# Patient Record
Sex: Female | Born: 1985 | Race: Black or African American | Hispanic: No | Marital: Single | Smoking: Current every day smoker
Health system: Southern US, Community
[De-identification: ages and names within clinical notes are randomized; demographics above are authoritative.]

---

## 2013-06-11 ENCOUNTER — Encounter (HOSPITAL_COMMUNITY): Payer: Self-pay | Admitting: Emergency Medicine

## 2013-06-11 ENCOUNTER — Emergency Department (HOSPITAL_COMMUNITY)
Admission: EM | Admit: 2013-06-11 | Discharge: 2013-06-11 | Disposition: A | Discharge status: Home / Self Care | Payer: Medicaid Other | Source: Home / Self Care | Attending: Family Medicine | Admitting: Family Medicine

## 2013-06-11 DIAGNOSIS — K047 Periapical abscess without sinus: Secondary | ICD-10-CM

## 2013-06-11 MED ORDER — HYDROCODONE-ACETAMINOPHEN 5-325 MG PO TABS: 1.0000 | ORAL_TABLET | Freq: Four times a day (QID) | ORAL | Status: DC | PRN

## 2013-06-11 MED ORDER — CLINDAMYCIN HCL 300 MG PO CAPS: 300.0000 mg | ORAL_CAPSULE | Freq: Three times a day (TID) | ORAL | Status: DC

## 2013-06-11 NOTE — Discharge Instructions (Signed)
Thank you for coming in today. Clindamycin 3 times daily for dental infection Use Norco for severe pain as needed.  Take 2 Aleve twice daily for pain.   Please call Dr. Lawrence Marseillesivils office [phone] or cell [phone] [address], [place]  Cost for tooth removal $200 includes exam, Xray, and extraction and follow up visit.  Bring list of current medications with you.    Dental Abscess A dental abscess is a collection of infected fluid (pus) from a bacterial infection in the inner part of the tooth (pulp). It usually occurs at the end of the tooth's root.  CAUSES   Severe tooth decay.  Trauma to the tooth that allows bacteria to enter into the pulp, such as a broken or chipped tooth. SYMPTOMS   Severe pain in and around the infected tooth.  Swelling and redness around the abscessed tooth or in the mouth or face.  Tenderness.  Pus drainage.  Bad breath.  Bitter taste in the mouth.  Difficulty swallowing.  Difficulty opening the mouth.  Nausea.  Vomiting.  Chills.  Swollen neck glands. DIAGNOSIS   A medical and dental history will be taken.  An examination will be performed by tapping on the abscessed tooth.  X-rays may be taken of the tooth to identify the abscess. TREATMENT The goal of treatment is to eliminate the infection. You may be prescribed antibiotic medicine to stop the infection from spreading. A root canal may be performed to save the tooth. If the tooth cannot be saved, it may be pulled (extracted) and the abscess may be drained.  HOME CARE INSTRUCTIONS  Only take over-the-counter or prescription medicines for pain, fever, or discomfort as directed by your caregiver.  Rinse your mouth (gargle) often with salt water ( tsp salt in 8 oz [250 ml] of warm water) to relieve pain or swelling.  Do not drive after taking pain medicine (narcotics).  Do not apply heat to the outside of your face.  Return to your dentist for further  treatment as directed. SEEK MEDICAL CARE IF:  Your pain is not helped by medicine.  Your pain is getting worse instead of better. SEEK IMMEDIATE MEDICAL CARE IF:  You have a fever or persistent symptoms for more than 2 3 days.  You have a fever and your symptoms suddenly get worse.  You have chills or a very bad headache.  You have problems breathing or swallowing.  You have trouble opening your mouth.  You have swelling in the neck or around the eye. Document Released: 05/10/2005 Document Revised: 02/02/2012 Document Reviewed: 08/18/2010 [facility] Patient Information 2014 [employer] [place].

## 2013-06-11 NOTE — ED Notes (Signed)
C/o R lower jaw tooth abscess onset last night.

## 2013-06-11 NOTE — ED Provider Notes (Signed)
Breanna Simmons is a 28 y.o. female who presents to Urgent Care today for right lower dental pain and swelling starting yesterday evening worsening today. Patient had severe pain worse with chewing. She denies any fevers chills nausea vomiting or diarrhea. She suspects that she has a dental abscess. She does have dental and health insurance but does not know any dentists in the area. She feels well otherwise   History reviewed. No pertinent past medical history. History  Substance Use Topics  . Smoking status: Current Every Day Smoker -- 0.50 packs/day    Types: Cigarettes  . Smokeless tobacco: Not on file  . Alcohol Use: No   ROS as above Medications: No current facility-administered medications for this encounter.   Current Outpatient Prescriptions  Medication Sig Dispense Refill  . acetaminophen (TYLENOL) 650 MG CR tablet Take 1,300 mg by mouth every 8 (eight) hours as needed for pain.      . clindamycin (CLEOCIN) 300 MG capsule Take 1 capsule (300 mg total) by mouth 3 (three) times daily.  60 capsule  0  . HYDROcodone-acetaminophen (NORCO/VICODIN) 5-325 MG per tablet Take 1 tablet by mouth every 6 (six) hours as needed.  15 tablet  0    Exam:  BP 124/85  Pulse 90  Temp(Src) 99.1 F (37.3 C) (Oral)  Resp 16  SpO2 100%  LMP 05/30/2013 Gen: Well NAD HEENT: EOMI,  MMM, right jaw swelling and tenderness. Dental abscess present right lower molar. Pus is visible at the gum line. This area is very tender to touch with a tongue depressor.  Lungs: Normal work of breathing. CTABL Heart: RRR no MRG Abd: NABS, Soft. NT, ND Exts: Brisk capillary refill, warm and well perfused.   Dental injection: Consent obtained Topical numbing medicine applied to the base of the tooth 1.8 mL of Marcaine and epinephrine were injected into the base of the tooth at the junction of the gum and cheek achieving good anesthesia. Patient tolerated procedure well.  After good anesthesia a 11 blade scalpel  was used to open the abscess. A moderate amount of pus was expressed.  Assessment and Plan: 28 y.o. female with dental abscess status post dental injection and abscess incision and drainage..  Plan the patient patient on clindamycin antibiotics, provide pain medication, and refer to dentist.   Discussed warning signs or symptoms. Please see discharge instructions. Patient expresses understanding.    Rodolph BongEvan S Ragan Reale, MD 06/11/13 2104

## 2013-07-13 ENCOUNTER — Emergency Department (HOSPITAL_COMMUNITY)
Admission: EM | Admit: 2013-07-13 | Discharge: 2013-07-13 | Disposition: A | Discharge status: Home / Self Care | Payer: Medicaid Other | Source: Home / Self Care | Attending: Family Medicine | Admitting: Family Medicine

## 2013-07-13 ENCOUNTER — Encounter (HOSPITAL_COMMUNITY): Payer: Self-pay | Admitting: Emergency Medicine

## 2013-07-13 DIAGNOSIS — K044 Acute apical periodontitis of pulpal origin: Secondary | ICD-10-CM

## 2013-07-13 DIAGNOSIS — K047 Periapical abscess without sinus: Secondary | ICD-10-CM

## 2013-07-13 MED ORDER — HYDROCODONE-ACETAMINOPHEN 5-325 MG PO TABS
ORAL_TABLET | ORAL | Status: AC
  Filled 2013-07-13: qty 2

## 2013-07-13 MED ORDER — HYDROCODONE-ACETAMINOPHEN 5-325 MG PO TABS: 1.0000 | ORAL_TABLET | Freq: Four times a day (QID) | ORAL | Status: AC | PRN

## 2013-07-13 MED ORDER — AMOXICILLIN 875 MG PO TABS: 875.0000 mg | ORAL_TABLET | Freq: Two times a day (BID) | ORAL | Status: DC

## 2013-07-13 MED ORDER — IBUPROFEN 800 MG PO TABS: 800.0000 mg | ORAL_TABLET | Freq: Three times a day (TID) | ORAL | Status: AC

## 2013-07-13 MED ORDER — HYDROCODONE-ACETAMINOPHEN 5-325 MG PO TABS
2.0000 | ORAL_TABLET | Freq: Once | ORAL | Status: AC
  Administered 2013-07-13: 2 via ORAL

## 2013-07-13 NOTE — Discharge Instructions (Signed)
°  Dental Abscess °A dental abscess is a collection of infected fluid (pus) from a bacterial infection in the inner part of the tooth (pulp). It usually occurs at the end of the tooth's root.  °CAUSES  °· Severe tooth decay. °· Trauma to the tooth that allows bacteria to enter into the pulp, such as a broken or chipped tooth. °SYMPTOMS  °· Severe pain in and around the infected tooth. °· Swelling and redness around the abscessed tooth or in the mouth or face. °· Tenderness. °· Pus drainage. °· Bad breath. °· Bitter taste in the mouth. °· Difficulty swallowing. °· Difficulty opening the mouth. °· Nausea. °· Vomiting. °· Chills. °· Swollen neck glands. °DIAGNOSIS  °· A medical and dental history will be taken. °· An examination will be performed by tapping on the abscessed tooth. °· X-rays may be taken of the tooth to identify the abscess. °TREATMENT °The goal of treatment is to eliminate the infection. You may be prescribed antibiotic medicine to stop the infection from spreading. A root canal may be performed to save the tooth. If the tooth cannot be saved, it may be pulled (extracted) and the abscess may be drained.  °HOME CARE INSTRUCTIONS °· Only take over-the-counter or prescription medicines for pain, fever, or discomfort as directed by your caregiver. °· Rinse your mouth (gargle) often with salt water (¼ tsp salt in 8 oz [250 ml] of warm water) to relieve pain or swelling. °· Do not drive after taking pain medicine (narcotics). °· Do not apply heat to the outside of your face. °· Return to your dentist for further treatment as directed. °SEEK MEDICAL CARE IF: °· Your pain is not helped by medicine. °· Your pain is getting worse instead of better. °SEEK IMMEDIATE MEDICAL CARE IF: °· You have a fever or persistent symptoms for more than 2 3 days. °· You have a fever and your symptoms suddenly get worse. °· You have chills or a very bad headache. °· You have problems breathing or swallowing. °· You have trouble  opening your mouth. °· You have swelling in the neck or around the eye. °Document Released: 05/10/2005 Document Revised: 02/02/2012 Document Reviewed: 08/18/2010 °ExitCare® Patient Information ©2014 [employer]. ° ° °

## 2013-07-13 NOTE — ED Notes (Signed)
C/o left sided dental pain states "hole in tooth"  Also having pain in left ear.  No relief with tylenol.  On set last night.

## 2013-07-13 NOTE — ED Provider Notes (Signed)
CSN: [id number]     Arrival date & time 07/13/13  1228 History   First MD Initiated Contact with Patient 07/13/13 1320     Chief Complaint  Patient presents with  . Dental Pain  . Otalgia     (Consider location/radiation/quality/duration/timing/severity/associated sxs/prior Treatment) HPI Comments: [id number] c/o Dental pain for 7 days, getting much worse yesterday.  Nagging pain for a week.  No longer responding to tylenol.  Lower left posterior.  Has appt for 2 weeks with dentist to get lower right posterior molar pulled that caused abscess she was here for a couple weeks ago, that side is much better.  Rates pain 10/10.  No systemic symptoms    History reviewed. No pertinent past medical history. Past Surgical History  Procedure Laterality Date  . Cesarean section  2006,2008,2009    x3   Family History  Problem Relation Age of Onset  . Hypertension Mother   . Diabetes Mother    History  Substance Use Topics  . Smoking status: Current Every Day Smoker -- 0.50 packs/day    Types: Cigarettes  . Smokeless tobacco: Not on file  . Alcohol Use: No   OB History   Grav Para Term Preterm Abortions TAB SAB Ect Mult Living                 Review of Systems  Constitutional: Negative for fever and chills.  HENT: Positive for dental problem.   Eyes: Negative for visual disturbance.  Respiratory: Negative for cough and shortness of breath.   Cardiovascular: Negative for chest pain, palpitations and leg swelling.  Gastrointestinal: Negative for nausea, vomiting and abdominal pain.  Endocrine: Negative for polydipsia and polyuria.  Genitourinary: Negative for dysuria, urgency and frequency.  Musculoskeletal: Negative for arthralgias and myalgias.  Skin: Negative for rash.  Neurological: Negative for dizziness, weakness and light-headedness.      Allergies  Review of patient's allergies indicates no known allergies.  Home Medications   Current Outpatient Rx  Name  Route  Sig   Dispense  Refill  . acetaminophen (TYLENOL) 650 MG CR tablet   Oral   Take 1,300 mg by mouth every 8 (eight) hours as needed for pain.         Marland Kitchen. amoxicillin (AMOXIL) 875 MG tablet   Oral   Take 1 tablet (875 mg total) by mouth 2 (two) times daily.   28 tablet   0   . clindamycin (CLEOCIN) 300 MG capsule   Oral   Take 1 capsule (300 mg total) by mouth 3 (three) times daily.   60 capsule   0   . HYDROcodone-acetaminophen (NORCO) 5-325 MG per tablet   Oral   Take 1 tablet by mouth every 6 (six) hours as needed for moderate pain.   15 tablet   0   . HYDROcodone-acetaminophen (NORCO/VICODIN) 5-325 MG per tablet   Oral   Take 1 tablet by mouth every 6 (six) hours as needed.   15 tablet   0   . ibuprofen (ADVIL,MOTRIN) 800 MG tablet   Oral   Take 1 tablet (800 mg total) by mouth 3 (three) times daily.   30 tablet   1     Dispense as written.    BP 114/76  Pulse 80  Temp(Src) 98.5 F (36.9 C) (Oral)  Resp 16  SpO2 98%  LMP 06/30/2013 Physical Exam  Nursing note and vitals reviewed. Constitutional: She is oriented to person, place, and time. Vital signs are normal. She  appears well-developed and well-nourished. No distress.  HENT:  Head: Normocephalic and atraumatic.  Mouth/Throat: Abnormal dentition (Left rear lower posterior molar (wisdom tooth) is partially covered by the gums. The tissue over this is macerated, erythematous, tender. No purulent drainage).  Pulmonary/Chest: Effort normal. No respiratory distress.  Neurological: She is alert and oriented to person, place, and time. She has normal strength. Coordination normal.  Skin: Skin is warm and dry. No rash noted. She is not diaphoretic.  Psychiatric: She has a normal mood and affect. Judgment normal.    ED Course  Procedures (including critical care time) Labs Review Labs Reviewed - No data to display Imaging Review No results found.    MDM   Final diagnoses:  Dental infection   No obvious  swelling or abscess to indicate need for incision and drainage. We'll treat with amoxicillin as well as symptomatic management with ibuprofen and Norco. Followup with the dentist.    Meds ordered this encounter  Medications  . amoxicillin (AMOXIL) 875 MG tablet    Sig: Take 1 tablet (875 mg total) by mouth 2 (two) times daily.    Dispense:  28 tablet    Refill:  0    Order Specific Question:  Supervising Provider    Answer:  Linna Hoff [phone]  . ibuprofen (ADVIL,MOTRIN) 800 MG tablet    Sig: Take 1 tablet (800 mg total) by mouth 3 (three) times daily.    Dispense:  30 tablet    Refill:  1    Order Specific Question:  Supervising Provider    Answer:  Linna Hoff [phone]  . HYDROcodone-acetaminophen (NORCO) 5-325 MG per tablet    Sig: Take 1 tablet by mouth every 6 (six) hours as needed for moderate pain.    Dispense:  15 tablet    Refill:  0    Order Specific Question:  Supervising Provider    Answer:  Linna Hoff [phone]  . HYDROcodone-acetaminophen (NORCO/VICODIN) 5-325 MG per tablet 2 tablet    Sig:      Graylon Good, PA-C 07/14/13 1408

## 2013-07-17 ENCOUNTER — Emergency Department (HOSPITAL_COMMUNITY): Payer: Medicaid Other

## 2013-07-17 ENCOUNTER — Emergency Department (HOSPITAL_COMMUNITY)
Admission: EM | Admit: 2013-07-17 | Discharge: 2013-07-17 | Disposition: A | Discharge status: Home / Self Care | Payer: Medicaid Other | Attending: Emergency Medicine | Admitting: Emergency Medicine

## 2013-07-17 ENCOUNTER — Encounter (HOSPITAL_COMMUNITY): Payer: Self-pay | Admitting: Emergency Medicine

## 2013-07-17 DIAGNOSIS — Z792 Long term (current) use of antibiotics: Secondary | ICD-10-CM | POA: Insufficient documentation

## 2013-07-17 DIAGNOSIS — K0889 Other specified disorders of teeth and supporting structures: Secondary | ICD-10-CM

## 2013-07-17 DIAGNOSIS — R509 Fever, unspecified: Secondary | ICD-10-CM | POA: Insufficient documentation

## 2013-07-17 DIAGNOSIS — F17200 Nicotine dependence, unspecified, uncomplicated: Secondary | ICD-10-CM | POA: Insufficient documentation

## 2013-07-17 DIAGNOSIS — R51 Headache: Secondary | ICD-10-CM | POA: Insufficient documentation

## 2013-07-17 DIAGNOSIS — R209 Unspecified disturbances of skin sensation: Secondary | ICD-10-CM | POA: Insufficient documentation

## 2013-07-17 DIAGNOSIS — K089 Disorder of teeth and supporting structures, unspecified: Secondary | ICD-10-CM | POA: Insufficient documentation

## 2013-07-17 DIAGNOSIS — Z791 Long term (current) use of non-steroidal anti-inflammatories (NSAID): Secondary | ICD-10-CM | POA: Insufficient documentation

## 2013-07-17 LAB — BASIC METABOLIC PANEL
BUN: 8 mg/dL (ref 6–23)
CHLORIDE: 103 meq/L (ref 96–112)
CO2: 21 meq/L (ref 19–32)
CREATININE: 0.6 mg/dL (ref 0.50–1.10)
Calcium: 9.1 mg/dL (ref 8.4–10.5)
GFR calc non Af Amer: >90 mL/min (ref >90)
Glucose, Bld: 91 mg/dL (ref 70–99)
Potassium: 4.1 mEq/L (ref 3.7–5.3)
Sodium: 137 mEq/L (ref 137–147)

## 2013-07-17 LAB — CBC WITH DIFFERENTIAL/PLATELET
Basophils Absolute: 0 10*3/uL (ref 0.0–0.1)
Basophils Relative: 0 % (ref 0–1)
Eosinophils Absolute: 0.1 10*3/uL (ref 0.0–0.7)
Eosinophils Relative: 1 % (ref 0–5)
HEMATOCRIT: 36.2 % (ref 36.0–46.0)
HEMOGLOBIN: 11.6 g/dL — AB (ref 12.0–15.0)
LYMPHS ABS: 1.8 10*3/uL (ref 0.7–4.0)
LYMPHS PCT: 18 % (ref 12–46)
MCH: 25.8 pg — ABNORMAL LOW (ref 26.0–34.0)
MCHC: 32 g/dL (ref 30.0–36.0)
MCV: 80.6 fL (ref 78.0–100.0)
MONO ABS: 1.2 10*3/uL — AB (ref 0.1–1.0)
MONOS PCT: 12 % (ref 3–12)
NEUTROS ABS: 7 10*3/uL (ref 1.7–7.7)
NEUTROS PCT: 69 % (ref 43–77)
Platelets: 283 10*3/uL (ref 150–400)
RBC: 4.49 MIL/uL (ref 3.87–5.11)
RDW: 18 % — ABNORMAL HIGH (ref 11.5–15.5)
WBC: 10.1 10*3/uL (ref 4.0–10.5)

## 2013-07-17 MED ORDER — IOHEXOL 300 MG/ML  SOLN
75.0000 mL | Freq: Once | INTRAMUSCULAR | Status: AC | PRN
  Administered 2013-07-17: 75 mL via INTRAVENOUS

## 2013-07-17 MED ORDER — ONDANSETRON HCL 4 MG/2ML IJ SOLN
4.0000 mg | Freq: Once | INTRAMUSCULAR | Status: AC
  Administered 2013-07-17: 4 mg via INTRAVENOUS
  Filled 2013-07-17: qty 2

## 2013-07-17 MED ORDER — MORPHINE SULFATE 4 MG/ML IJ SOLN
4.0000 mg | Freq: Once | INTRAMUSCULAR | Status: AC
  Administered 2013-07-17: 4 mg via INTRAVENOUS
  Filled 2013-07-17: qty 1

## 2013-07-17 MED ORDER — OXYCODONE-ACETAMINOPHEN 5-325 MG PO TABS: 1.0000 | ORAL_TABLET | ORAL | Status: AC | PRN

## 2013-07-17 MED ORDER — CLINDAMYCIN PHOSPHATE 600 MG/50ML IV SOLN
600.0000 mg | Freq: Once | INTRAVENOUS | Status: AC
  Administered 2013-07-17: 600 mg via INTRAVENOUS
  Filled 2013-07-17: qty 50

## 2013-07-17 MED ORDER — PROMETHAZINE HCL 25 MG PO TABS: 25.0000 mg | ORAL_TABLET | Freq: Four times a day (QID) | ORAL | Status: AC | PRN

## 2013-07-17 MED ORDER — CLINDAMYCIN HCL 150 MG PO CAPS: 300.0000 mg | ORAL_CAPSULE | Freq: Three times a day (TID) | ORAL | Status: AC

## 2013-07-17 NOTE — ED Notes (Addendum)
Pt arrives with left facial swelling since overnight. Seen at Urgent Care for dental pain Friday and has been taking antibiotics.

## 2013-07-17 NOTE — ED Notes (Signed)
Pt returned from radiology.

## 2013-07-17 NOTE — ED Provider Notes (Signed)
CSN: [id number]     Arrival date & time 07/17/13  4098 History  This chart was scribed for Breanna Silk, PA, working with Breanna Canal, MD, by Ardelia Mems ED Scribe. This patient was seen in room TR05C/TR05C and the patient's care was started at 10:24 AM .  Chief Complaint  Patient presents with  . Dental Pain    The history is provided by the patient. No language interpreter was used.   HPI Comments: Breanna Simmons is a 28 y.o. female who presents to the Emergency Department complaining of throbbing pain on the left side of her jaw beginning yesterday evening. She also reports associated HA and lip numbness. She reports she cannot open her mouth and a fever for which she took tylenol around 7 AM. ED temperature is 99.4 F. Pt is from [place] and has been told she needs her wisdom teeth extracted. She reports she has not followed up on these recommendations. Pt reports going to urgent care 4 days ago where she received antibiotics that she is still taking. The patient has had similar symptoms in the past where she needed IV antibiotics. Pt denies h/o medical issues.    History reviewed. No pertinent past medical history. Past Surgical History  Procedure Laterality Date  . Cesarean section  2006,2008,2009    x3    Family History  Problem Relation Age of Onset  . Hypertension Mother   . Diabetes Mother     History  Substance Use Topics  . Smoking status: Current Every Day Smoker -- 0.50 packs/day    Types: Cigarettes  . Smokeless tobacco: Not on file  . Alcohol Use: No    OB History   Grav Para Term Preterm Abortions TAB SAB Ect Mult Living                 Review of Systems  Constitutional: Positive for fever.  HENT: Positive for dental problem.        Lip numbness  Neurological: Positive for headaches.  All other systems reviewed and are negative.      Allergies  Review of patient's allergies indicates no known allergies.  Home Medications   Current Outpatient Rx   Name  Route  Sig  Dispense  Refill  . acetaminophen (TYLENOL) 650 MG CR tablet   Oral   Take 1,300 mg by mouth every 8 (eight) hours as needed for pain.         Marland Kitchen amoxicillin (AMOXIL) 875 MG tablet   Oral   Take 875 mg by mouth 2 (two) times daily. For 10 days         . HYDROcodone-acetaminophen (NORCO) 5-325 MG per tablet   Oral   Take 1 tablet by mouth every 6 (six) hours as needed for moderate pain.   15 tablet   0   . ibuprofen (ADVIL,MOTRIN) 800 MG tablet   Oral   Take 1 tablet (800 mg total) by mouth 3 (three) times daily.   30 tablet   1     Dispense as written.   . clindamycin (CLEOCIN) 150 MG capsule   Oral   Take 2 capsules (300 mg total) by mouth 3 (three) times daily. May dispense as 150mg  capsules   60 capsule   0   . oxyCODONE-acetaminophen (PERCOCET/ROXICET) 5-325 MG per tablet   Oral   Take 1-2 tablets by mouth every 4 (four) hours as needed for severe pain.   12 tablet   0   . promethazine (  PHENERGAN) 25 MG tablet   Oral   Take 1 tablet (25 mg total) by mouth every 6 (six) hours as needed for nausea or vomiting.   12 tablet   0    Triage Vitals: BP 129/85  Pulse 102  Temp(Src) 99.4 F (37.4 C) (Oral)  Resp 18  SpO2 98%  LMP 06/30/2013  Physical Exam  Nursing note and vitals reviewed. Constitutional: She is oriented to person, place, and time. She appears well-developed and well-nourished. No distress.  HENT:  Head: Normocephalic and atraumatic.  Right Ear: External ear normal.  Left Ear: External ear normal.  Nose: Nose normal.  Mouth/Throat: Oropharynx is clear and moist.  Trismus, tenderness to palpation under tongue, visible swelling to the Left side of her face, no drainable abscess visualized.  Eyes: Conjunctivae are normal.  Neck: Normal range of motion.  Cardiovascular: Normal rate, regular rhythm and normal heart sounds.   Pulmonary/Chest: Effort normal and breath sounds normal. No stridor. No respiratory distress. She has  no wheezes. She has no rales.  Abdominal: Soft. She exhibits no distension.  Musculoskeletal: Normal range of motion.  Neurological: She is alert and oriented to person, place, and time. She has normal strength.  Skin: Skin is warm and dry. She is not diaphoretic. No erythema.  Psychiatric: She has a normal mood and affect. Her behavior is normal.    ED Course  Procedures (including critical care time)  DIAGNOSTIC STUDIES: Oxygen Saturation is 98% on RA, normal by my interpretation.    COORDINATION OF CARE:  10:29 AM-Discussed treatment plan which includes CT scan and diagnostic lab work with pt at bedside and pt agreed to plan.  Medications  clindamycin (CLEOCIN) IVPB 600 mg (0 mg Intravenous Stopped 07/17/13 1240)  morphine 4 MG/ML injection 4 mg (4 mg Intravenous Given 07/17/13 1133)  ondansetron (ZOFRAN) injection 4 mg (4 mg Intravenous Given 07/17/13 1131)  iohexol (OMNIPAQUE) 300 MG/ML solution 75 mL (75 mLs Intravenous Contrast Given 07/17/13 1227)    Labs Review Labs Reviewed  CBC WITH DIFFERENTIAL - Abnormal; Notable for the following:    Hemoglobin 11.6 (*)    MCH 25.8 (*)    RDW 18.0 (*)    Monocytes Absolute 1.2 (*)    All other components within normal limits  BASIC METABOLIC PANEL   Imaging Review Ct Soft Tissue Neck W Contrast  07/17/2013   CLINICAL DATA:  Left jaw pain and fever.  Trismus  EXAM: CT NECK WITH CONTRAST  TECHNIQUE: Multidetector CT imaging of the neck was performed using the standard protocol following the bolus administration of intravenous contrast.  CONTRAST:  75mL OMNIPAQUE IOHEXOL 300 MG/ML  SOLN  COMPARISON:  None.  FINDINGS: Asymmetric enlargement of the masseter muscle on the left. There is edema in the subcutaneous fat and platysmas muscle on the left. Negative for soft tissue abscess. The findings are consistent with myositis. This appears to be of dental origin with periapical left lucency around the left lower third molar as well as caries in  this tooth. Dental evaluation is suggested.  Caries and cortical breakthrough also present involving the right third lower molar without evidence of soft tissue infection.  The tongue is normal. Tonsils are symmetric without mass or abscess. Thyroid, submandibular, and parotid glands are normal.  Mild adenopathy. Left level 2B lymph node measures 12 mm. Right level 2B node measures 9 mm. No abscessed node identified. Lung apices are clear.  IMPRESSION: Findings compatible with myositis involving the left masseter muscle. This  is most likely of dental origin related to infection in the left lower third molar.  Infection also noted in the right lower third molar.  Negative for soft tissue abscess.   Electronically Signed   By: Marlan Palauharles  Clark M.D.   On: 07/17/2013 13:00    EKG Interpretation   None       MDM   Final diagnoses:  Pain, dental    Patient with toothache.  No gross abscess.  Patient initially with trismus on my exam, however this was not consistent with the nurses exam. CT soft tissue showed dental infection with deep soft tissue infection. Patient received 1 dose of IV clindamycin and was sent home with rx for clindamycin as amoxicillin tx failed. She tolerated liquids and saltines in the ED. Urged patient to follow-up with dentist. Return instructions given. Vital signs stable for discharge. Patient / Family / Caregiver informed of clinical course, understand medical decision-making process, and agree with plan.     I personally performed the services described in this documentation, which was scribed in my presence. The recorded information has been reviewed and is accurate.      Mora BellmanHannah S Jhovanny Guinta, PA-C 07/17/13 1816

## 2013-07-17 NOTE — ED Notes (Signed)
Patient states has been taking antibiotics since Friday for dental pain.  Patient states her face had not had any swelling until last night into this morning.

## 2013-07-17 NOTE — ED Notes (Signed)
IV start unsuccessful x 1.  Phlebotomy advised could not get blood draw.

## 2013-07-17 NOTE — Discharge Instructions (Signed)
Dental Pain °Toothache is pain in or around a tooth. It may get worse with chewing or with cold or heat.  °HOME CARE °· Your dentist may use a numbing medicine during treatment. If so, you may need to avoid eating until the medicine wears off. Ask your dentist about this. °· Only take medicine as told by your dentist or doctor. °· Avoid chewing food near the painful tooth until after all treatment is done. Ask your dentist about this. °GET HELP RIGHT AWAY IF:  °· The problem gets worse or new problems appear. °· You have a fever. °· There is redness and puffiness (swelling) of the face, jaw, or neck. °· You cannot open your mouth. °· There is pain in the jaw. °· There is very bad pain that is not helped by medicine. °MAKE SURE YOU:  °· Understand these instructions. °· Will watch your condition. °· Will get help right away if you are not doing well or get worse. °Document Released: 10/27/2007 Document Revised: 08/02/2011 Document Reviewed: 10/27/2007 °ExitCare® Patient Information ©2014 [employer]. ° °

## 2013-07-18 NOTE — ED Provider Notes (Signed)
Medical screening examination/treatment/procedure(s) were performed by non-physician practitioner and as supervising physician I was immediately available for consultation/collaboration.  EKG Interpretation   None         Richardean Canalavid H Vermell [place], MD 07/18/13 1125

## 2013-07-23 NOTE — ED Provider Notes (Signed)
Medical screening examination/treatment/procedure(s) were performed by resident physician or non-physician practitioner and as supervising physician I was immediately available for consultation/collaboration.   KINDL,JAMES DOUGLAS MD.   James D Kindl, MD 07/23/13 1519 

## 2014-08-11 IMAGING — CT CT NECK W/ CM
4 of 5 series · 16 of 33 positions shown, 18 images · IV contrast (APPLIED)
Comparison: None.

CLINICAL DATA: Left jaw pain and fever.  Trismus

EXAM:
CT NECK WITH CONTRAST
TECHNIQUE: Multidetector CT imaging of the neck was performed using the
standard protocol following the bolus administration of intravenous
contrast.
CONTRAST:  75mL OMNIPAQUE IOHEXOL 300 MG/ML  SOLN

[Series 3: neck 2.0 i31s 3 · axial · 0.47mm/px · z∈[+443,+557]mm · 4 of 96 slices shown, 5 images]
[im 20/96  soft-tissue]
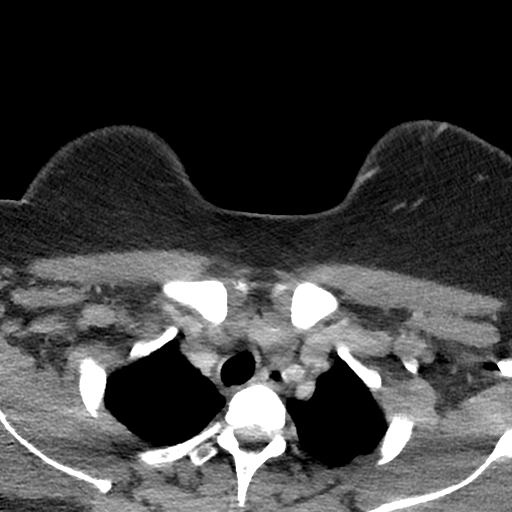
[im 20/96  bone]
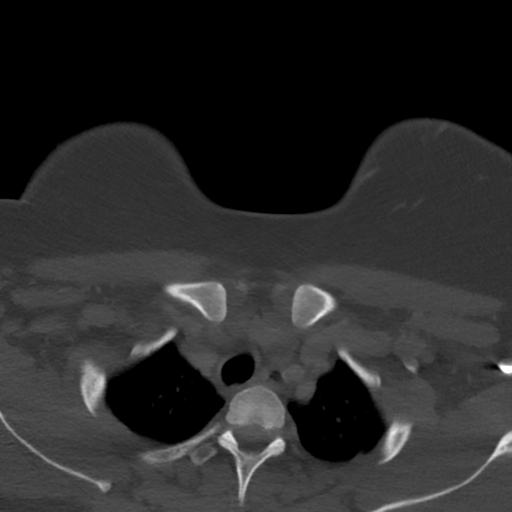
[im 39/96  bone]
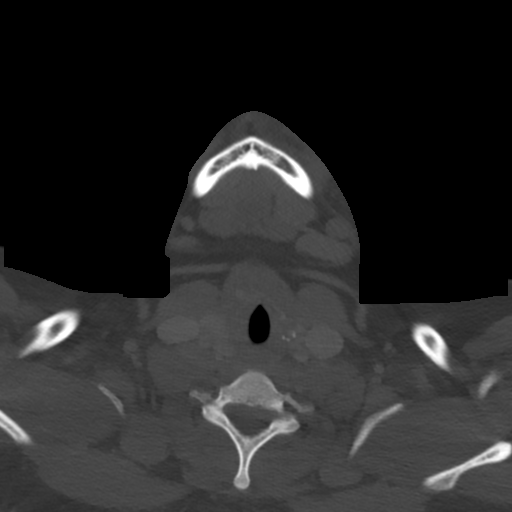
[im 58/96  bone]
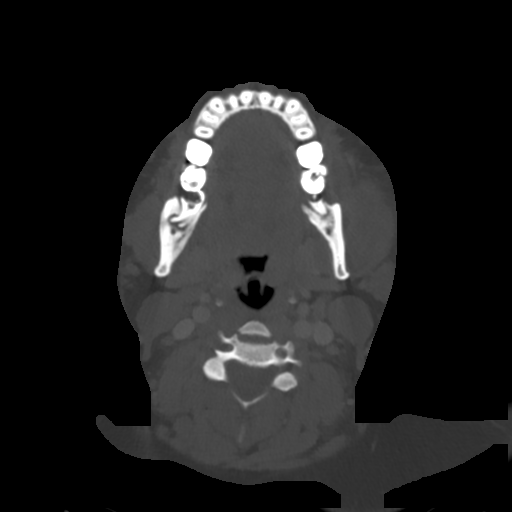
[im 77/96  bone]
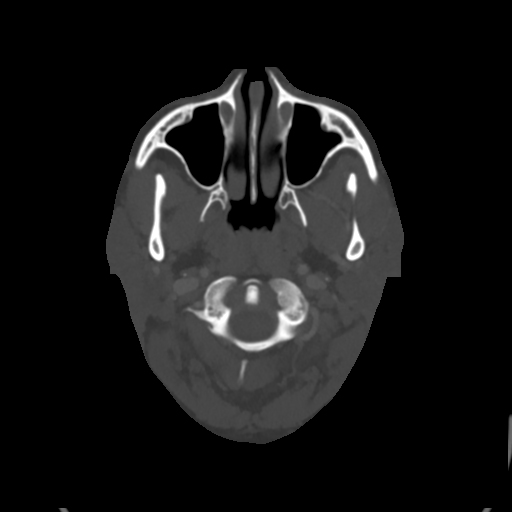

[Series 6: coronal st · coronal · 0.39mm/px · 3 of 105 slices shown]
[im 32/105  bone]
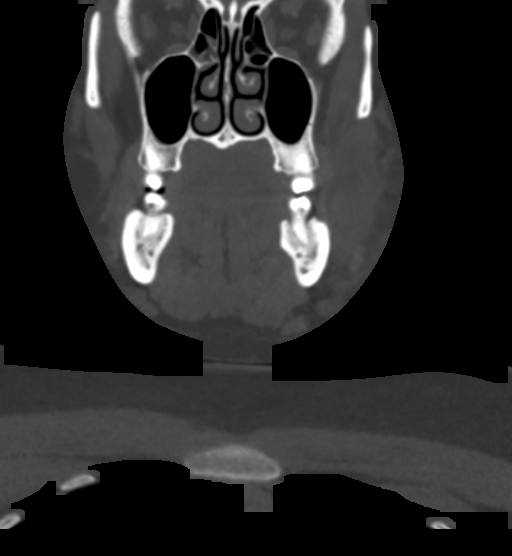
[im 46/105  bone]
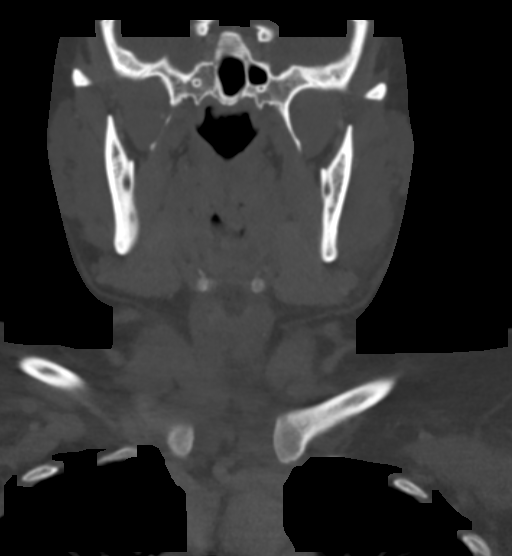
[im 59/105  bone]
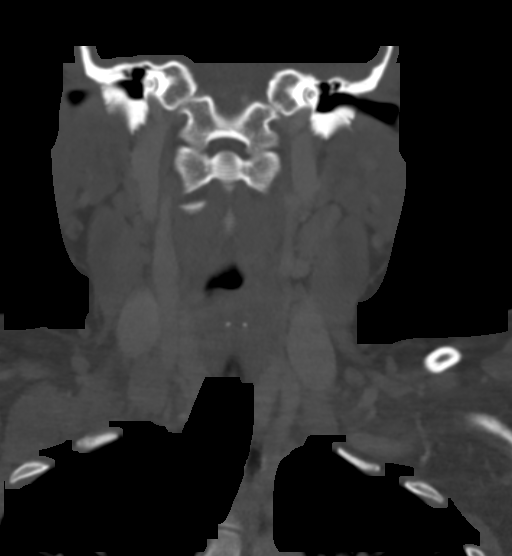

[Series 7: sagittal st · sagittal · 0.39mm/px · 5 of 84 slices shown, 6 images]
[im 28/84  bone]
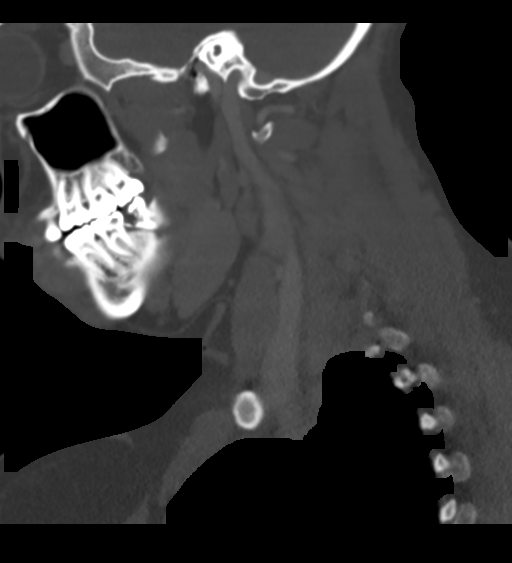
[im 35/84  bone]
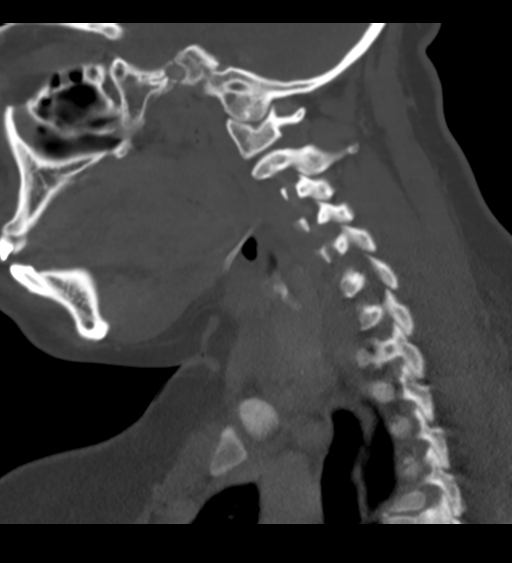
[im 42/84  soft-tissue]
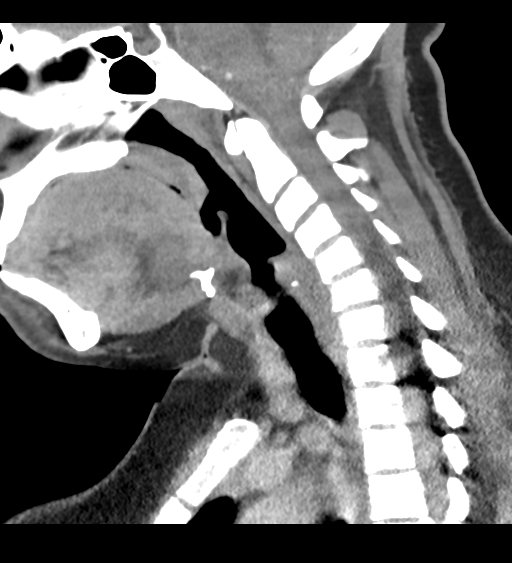
[im 42/84  bone]
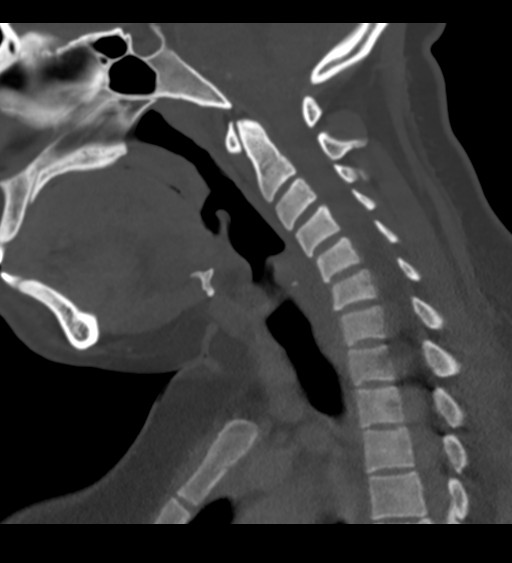
[im 49/84  bone]
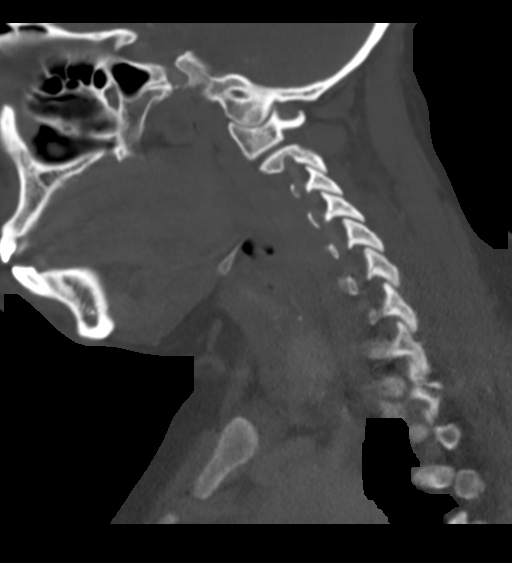
[im 56/84  bone]
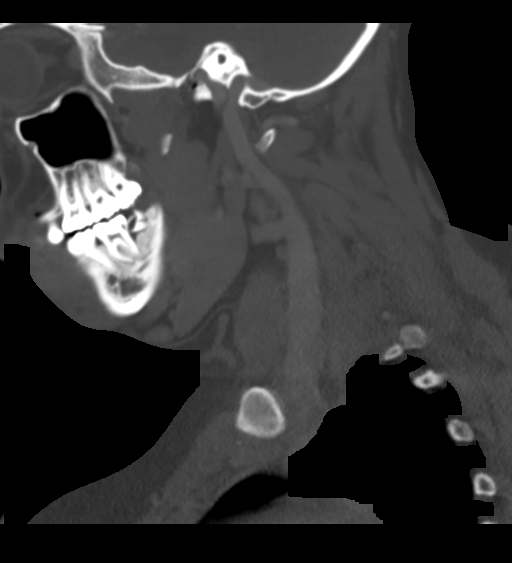

[Series 8: orthogonal st · axial · 0.41mm/px · z∈[+427,+543]mm · 4 of 101 slices shown]
[im 21/101  bone]
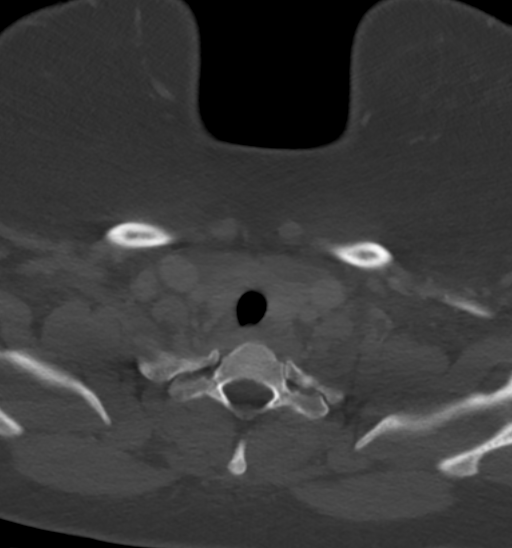
[im 41/101  bone]
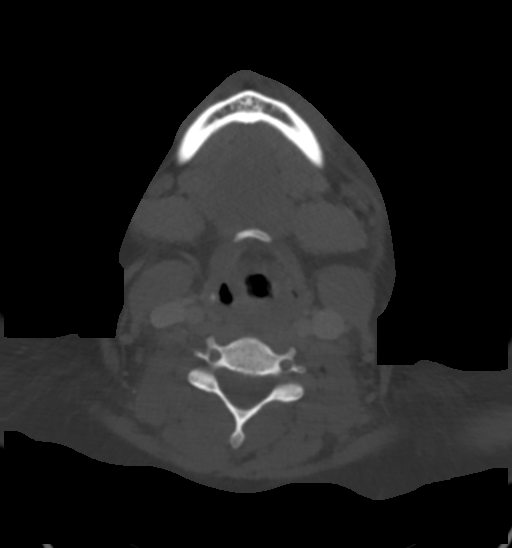
[im 61/101  bone]
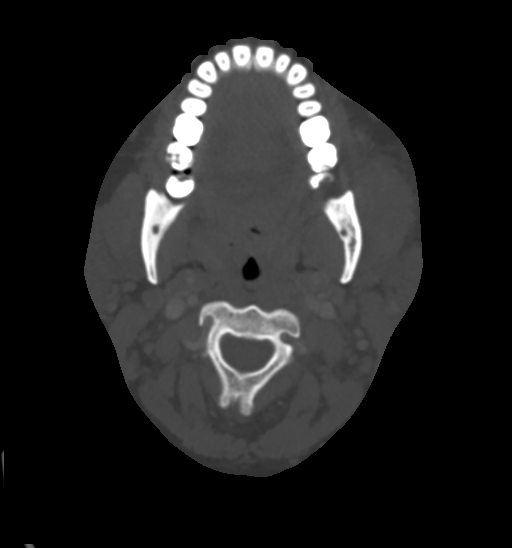
[im 81/101  bone]
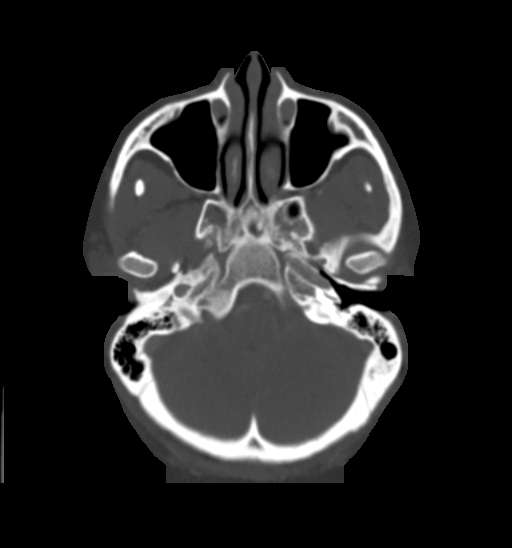

[16 of 33 positions shown; findings below may reference images not displayed]

FINDINGS: Asymmetric enlargement of the masseter muscle on the left. There is
edema in the subcutaneous fat and platysmas muscle on the left.
Negative for soft tissue abscess. The findings are consistent with
myositis. This appears to be of dental origin with periapical left
lucency around the left lower third molar as well as caries in this
tooth. Dental evaluation is suggested.

Caries and cortical breakthrough also present involving the right
third lower molar without evidence of soft tissue infection.

The tongue is normal. Tonsils are symmetric without mass or abscess.
Thyroid, submandibular, and parotid glands are normal.

Mild adenopathy. Left level 2B lymph node measures 12 mm. Right
level 2B node measures 9 mm. No abscessed node identified. Lung
apices are clear.
IMPRESSION: Findings compatible with myositis involving the left masseter
muscle. This is most likely of dental origin related to infection in
the left lower third molar.

Infection also noted in the right lower third molar.

Negative for soft tissue abscess.
